# Patient Record
Sex: Male | Born: 1935 | Race: White | Hispanic: No | Marital: Married
Health system: Southern US, Community
[De-identification: ages and names within clinical notes are randomized; demographics above are authoritative.]

---

## 2006-06-22 ENCOUNTER — Emergency Department: Payer: Self-pay | Admitting: Emergency Medicine

## 2007-06-02 ENCOUNTER — Ambulatory Visit: Payer: Self-pay | Admitting: Internal Medicine

## 2007-07-04 ENCOUNTER — Other Ambulatory Visit: Payer: Self-pay

## 2007-07-04 ENCOUNTER — Inpatient Hospital Stay: Payer: Self-pay | Admitting: Internal Medicine

## 2008-08-02 ENCOUNTER — Ambulatory Visit: Payer: Self-pay | Admitting: Internal Medicine

## 2009-11-03 ENCOUNTER — Ambulatory Visit: Payer: Self-pay | Admitting: Internal Medicine

## 2009-11-20 IMAGING — US ULTRASOUND AORTA
1 series · 17 of 18 positions shown · non-contrast
Comparison: none

REASON FOR EXAM: Known AAA,  compare to last CT
COMMENTS:

[Series 1: ultrasound aorta · 17 of 18 slices shown]
[im 1/18]
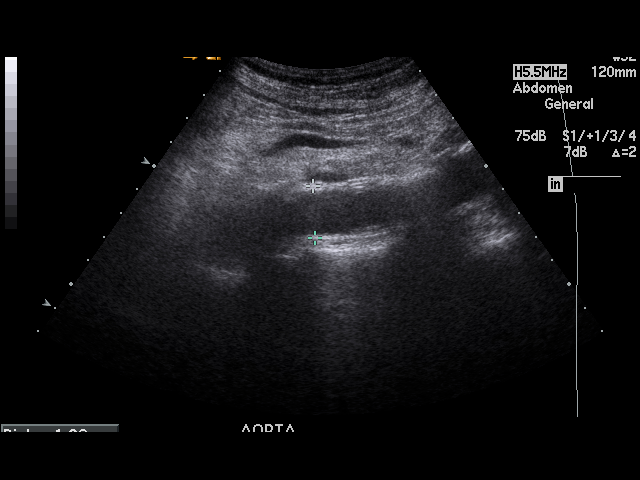
[im 2/18]
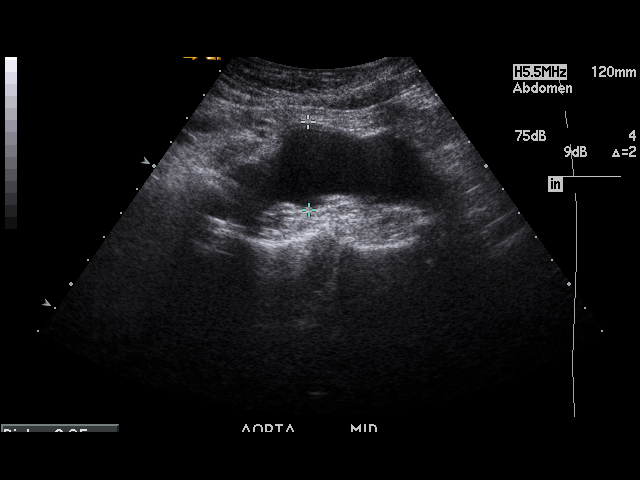
[im 3/18]
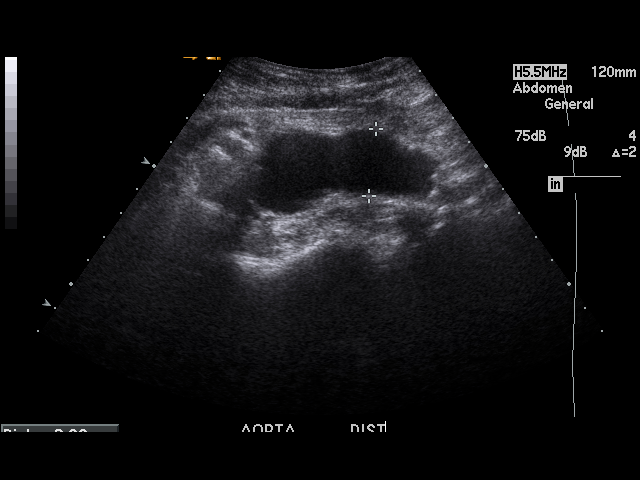
[im 4/18]
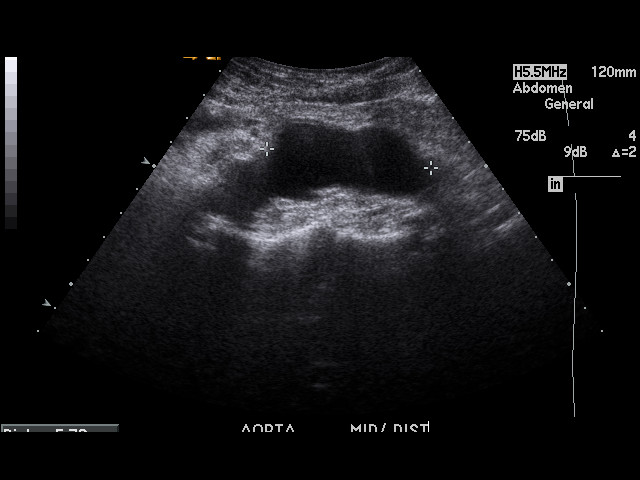
[im 5/18]
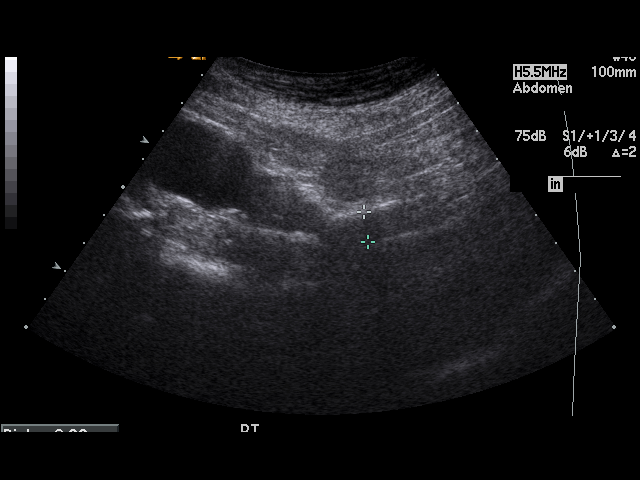
[im 6/18]
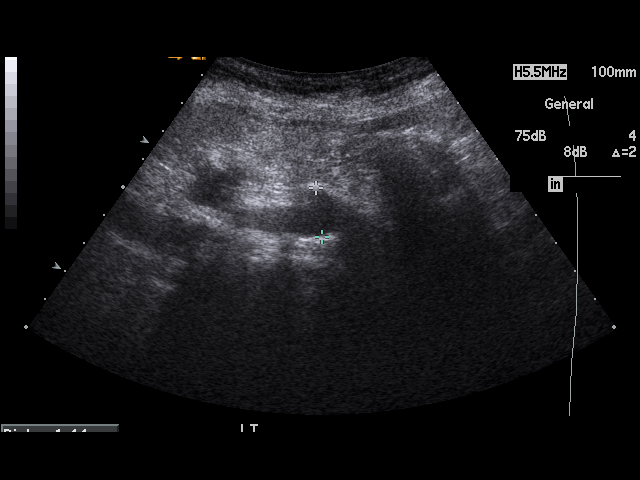
[im 7/18]
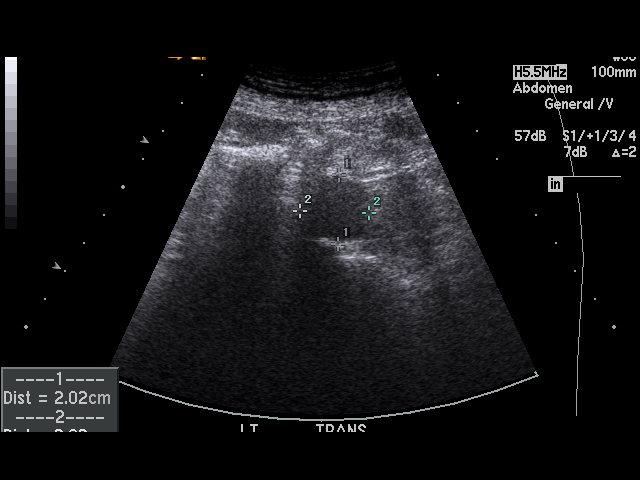
[im 8/18]
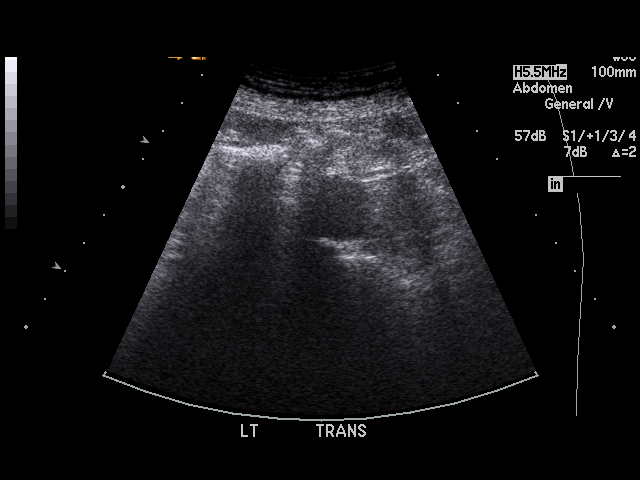
[im 10/18]
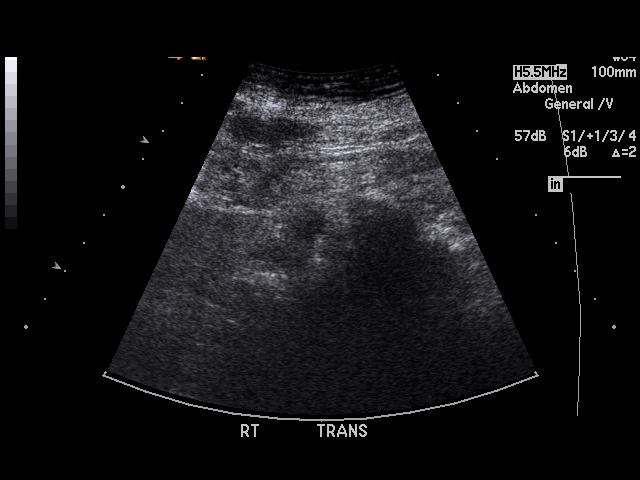
[im 11/18]
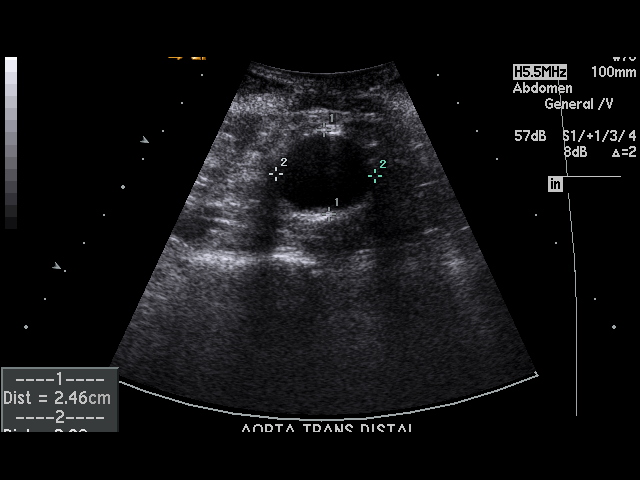
[im 12/18]
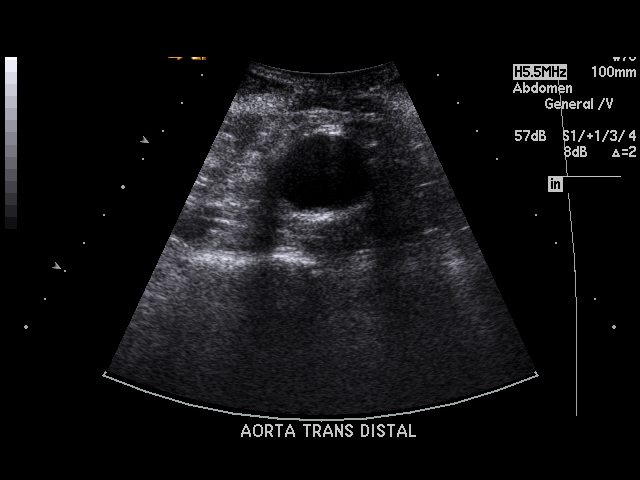
[im 13/18]
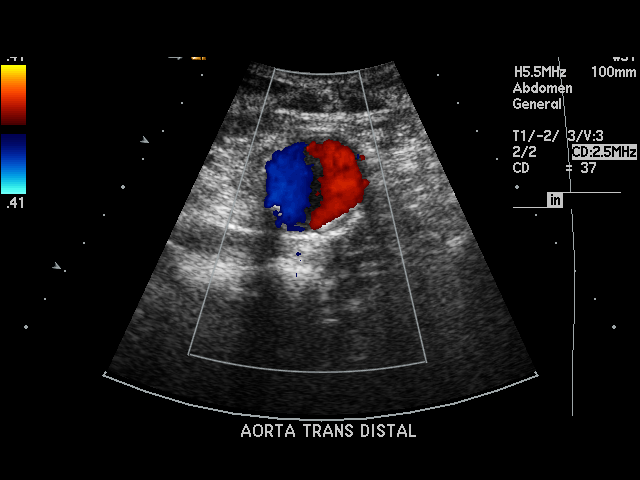
[im 14/18]
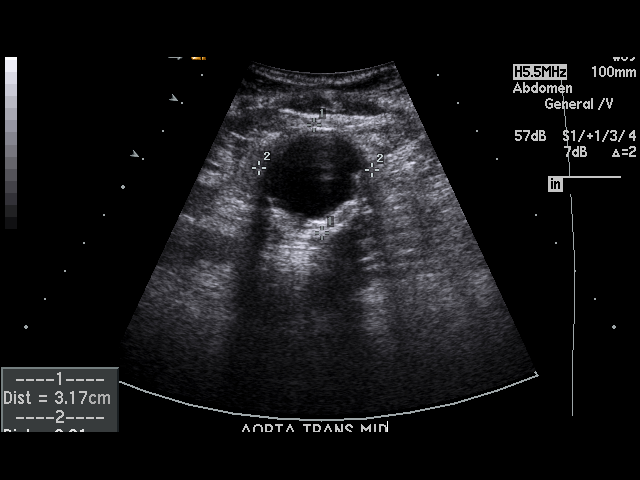
[im 15/18]
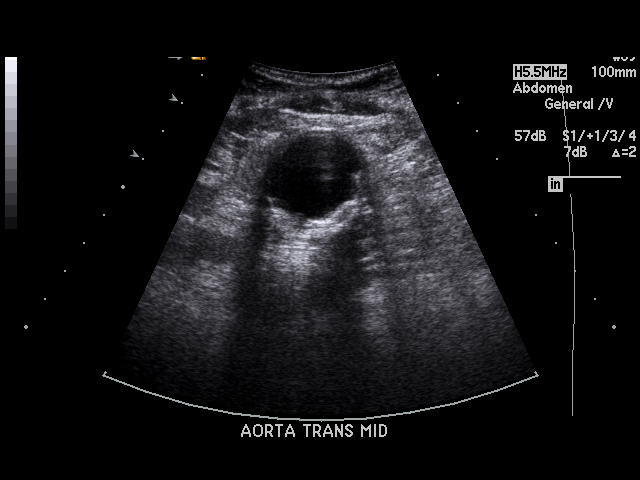
[im 16/18]
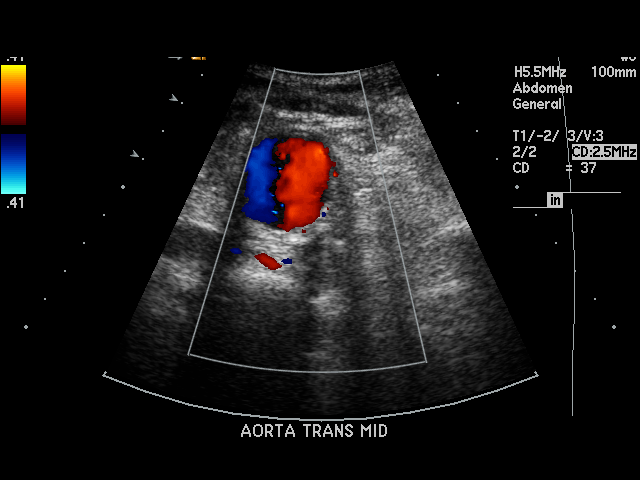
[im 17/18]
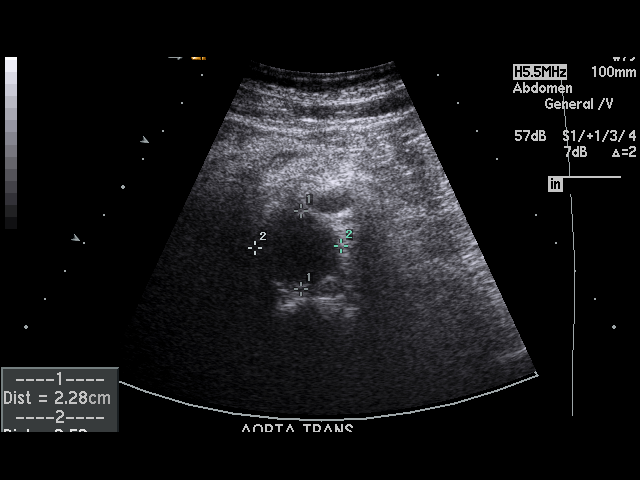
[im 18/18]
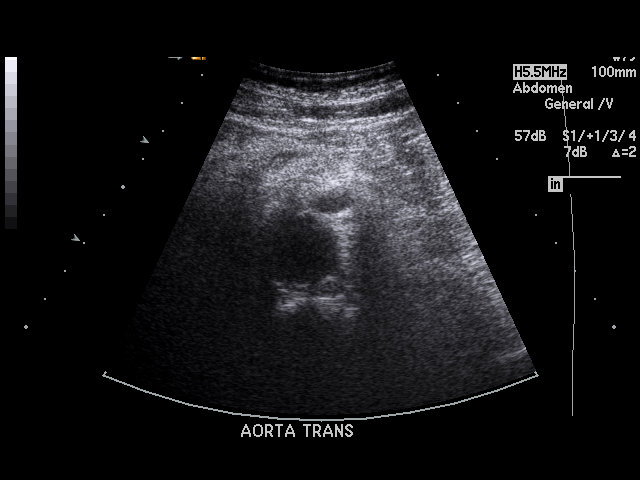

[17 of 18 positions shown; findings below may reference images not displayed]

PROCEDURE:     US  - US AORTA  - June 02, 2007  [DATE]

RESULT:     There is an elongated/lobulated aneurysm involving the mid and
distal abdominal aorta. The relationship to the renal arteries is uncertain.
This could be further evaluated by CTA, if clinically desired. The aneurysm
measures 3.17 cm at maximum AP diameter x 3.31 cm at maximum transverse
diameter. The aneurysm length measures approximately 5.7 cm. The common
iliac arteries are visualized bilaterally. There is a 2.0 cm aneurysm of the
proximal LEFT internal iliac. The RIGHT internal iliac measures 1.17 cm at
maximum diameter.
IMPRESSION: 1.  There is an elongated abdominal aorta aneurysm of the mid and distal
aorta as noted above and which measures 3.17 cm x 3.31 cm.
2.  There is a 2.0 cm aneurysm of the LEFT internal iliac artery.

## 2011-08-20 ENCOUNTER — Ambulatory Visit: Payer: Self-pay | Admitting: Internal Medicine

## 2014-05-03 ENCOUNTER — Ambulatory Visit
Admit: 2014-05-03 | Disposition: A | Discharge status: Home / Self Care | Payer: Self-pay | Attending: Family Medicine | Admitting: Family Medicine

## 2014-05-04 ENCOUNTER — Inpatient Hospital Stay: Payer: Self-pay | Admitting: Internal Medicine

## 2014-06-03 ENCOUNTER — Ambulatory Visit
Admit: 2014-06-03 | Disposition: A | Discharge status: Home / Self Care | Payer: Self-pay | Attending: Family Medicine | Admitting: Family Medicine

## 2014-06-27 LAB — SURGICAL PATHOLOGY

## 2014-07-03 NOTE — Consult Note (Signed)
Feels better today. No evidence of SBP. Remains on Abx. WBC down. WIll plan EGD today to check for portal HTN, esophageal varices, and submucosal lesion. Since INR ok, plan to do bx of cardia lesion if seen. Thanks.  Electronic Signatures: Lutricia Feilh, Samyria Rudie (MD)  (Signed on 03-Mar-16 13:20)  Authored  Last Updated: 03-Mar-16 13:20 by Lutricia Feilh, Ronnesha Mester (MD)

## 2014-07-03 NOTE — Consult Note (Signed)
Pt now back from paracentesis. Seen and examined. Poor historian. Hx of diverticular bleeding in 2009. Hx of back surgery in the past. Hx of prostate ca, s/p XRT. Abdominal pain, mainly RUQ area x > 5360yr. Increasing abdominal distension. Decreased appetite with weight loss. Alternating diarrhea with constipation. No rectal bleeding or melena. No fever/chills. U/S show gallstones/sludge. No evidence of cholecystitis. CT showed poss submucosal lesion in cardia, ascites, and probable cirrhosis and portal HTN. Denies alcohol use. WBC high. Paracentesis done to check for SBP. RUQ area soft and nontender now. Agree with Abx coverage. Agree with blood cx. Will eventually plan on EGD to evaluate stomach/esophagus. Await ascitic fluid analysis. Will follow. Thanks.  Electronic Signatures: Lutricia Feilh, Yehuda Printup (MD)  (Signed on 02-Mar-16 15:56)  Authored  Last Updated: 02-Mar-16 15:56 by Lutricia Feilh, Amariya Liskey (MD)

## 2014-07-03 NOTE — Discharge Summary (Signed)
PATIENT NAME:  Kenneth Meyers, Quanell W MR#:  [id number] DATE OF BIRTH:  [birth year]  DATE OF ADMISSION:  05/04/2014 DATE OF DISCHARGE:  05/07/2014   PRESENTING COMPLAINT: Abdominal pain.   DISCHARGE DIAGNOSES:  1. Cirrhosis of liver, etiology unknown.  2. Ascites with status post paracentesis.  3. Suspected chronic lymphocytic leukemia. No further workup per patient and family request.  4. Generalized weakness.  5. Duodenal ulcers.   PROCEDURES: 1. Paracentesis with removal of about 2.3 liters. 2. EGD showed duodenal ulcers.   NO CODE. DO NOT RESUSCITATE.   MEDICATIONS:  1. Alprazolam 0.25 mg p.o. every 8 hours as needed.  2. Protonix 40 mg b.i.d.   FOLLOWUP: 1. Follow up with Dr. Sampson GoonFitzgerald in 1 to 2 weeks.   2. Hospice to follow up at home.  CONSULTATIONS: 1. GI, Ezzard Standingaul Y. Bluford Kaufmannh, MD. 2. Oncology, Sandeep R. Sherrlyn HockPandit, MD.  BRIEF SUMMARY OF HOSPITAL COURSE: Mr. [name] is a very pleasant, 81 year old, Caucasian gentleman who comes into the Emergency Room with abdominal pain. He was found to have: 1. Cirrhosis with portal hypertension, etiology unknown. The patient underwent endoscopy. No varices were noted. However did have some gastric ulcerations and was started on PPI. He also underwent paracentesis with removal of 2.3 liters of fluid. Abdominal fluid was not infected. Antibiotics were discontinued. The patient was placed on PPI.  2. Leukocytosis with predominant lymphocytosis. The patient was seen by oncology. The patient's pathology differential showed B-cell morphology concerning for possible CLL. After discussion by Dr. Sherrlyn HockPandit with patient and family, they did not want to pursue any further workup. The patient's daughter was recommended to follow up with Dr. Sherrlyn HockPandit in case they decide on pursuing further workup.  3. Type 2 diabetes. A1c was 5.1. The patient is not on any medication.  4. Anxiety. PRN Xanax.  5. Duodenal ulcers, started on PPI. 6. Palliative care saw patient in  consultation for goals of care in light of probable CLL and other comorbidities, and patient and daughter decided patient requested DO NOT RESUSCITATE. The patient will be followed by hospice. Family was agreeable with the patient being followed by hospice at home, which had been set up.  Hospital stay otherwise remained stable.   TIME SPENT: 40 minutes.      ____________________________ Wylie HailSona A. Allena KatzPatel, MD sap:jh D: 05/08/2014 06:54:00 ET T: 05/08/2014 10:06:13 ET JOB#: [id number]  cc: Stann Mainlandavid P. Sampson GoonFitzgerald, MD Patricie Geeslin A. Allena KatzPatel, MD, <Dictator>    Willow OraSONA A Reba Hulett MD ELECTRONICALLY SIGNED 05/14/2014 14:26

## 2014-07-03 NOTE — Consult Note (Signed)
Chief Complaint:  Subjective/Chief Complaint Continues to feel better. No abd pain now. WBC slowly coming down. Tolerating liquid diet.   VITAL SIGNS/ANCILLARY NOTES: **Vital Signs.:   04-Mar-16 07:56  Vital Signs Type Q 8hr  Temperature Temperature (F) 98.2  Celsius 36.7  Temperature Source oral  Pulse Pulse 65  Respirations Respirations 18  Systolic BP Systolic BP 119  Diastolic BP (mmHg) Diastolic BP (mmHg) 63  Mean BP 81  Pulse Ox % Pulse Ox % 92  Pulse Ox Activity Level  At rest  Oxygen Delivery Room Air/ 21 %   Brief Assessment:  GEN no acute distress   Cardiac Regular   Respiratory clear BS   Gastrointestinal mild ascites. nontender   Lab Results: Routine Chem:  04-Mar-16 04:25   Result Comment DIFFERENTIAL - SLIDE PREVIOUSLY REVIEWED BY PATHOLOGIST  Result(s) reported on 06 May 2014 at 05:31AM.  Routine Hem:  04-Mar-16 04:25   WBC (CBC)  19.4  RBC (CBC)  3.33  Hemoglobin (CBC)  11.0  Hematocrit (CBC)  33.1  Platelet Count (CBC)  78  MCV 99  MCH 32.9  MCHC 33.2  RDW  16.9  Segmented Neutrophils 6  Lymphocytes 32  Variant Lymphocytes 56  Monocytes 6  Diff Comment 1 ANISOCYTOSIS  Diff Comment 2 NORMAL PLT MORPHOLGY  Diff Comment 3 SMUDGE CELLS  Result(s) reported on 06 May 2014 at 05:31AM.   Assessment/Plan:  Assessment/Plan:  Assessment Cirrhosis, ascites, portal HTN, sepsis?   Plan Continue Abx. Continue daily PPI. Will advance diet to reg. Will have Dr. Marva PandaSkulskie check on patient this weekend.   Electronic Signatures: Lutricia Feilh, Swayzie Choate (MD)  (Signed 04-Mar-16 10:27)  Authored: Chief Complaint, VITAL SIGNS/ANCILLARY NOTES, Brief Assessment, Lab Results, Assessment/Plan   Last Updated: 04-Mar-16 10:27 by Lutricia Feilh, Marili Vader (MD)

## 2014-07-03 NOTE — Consult Note (Signed)
PATIENT NAME:  Kenneth Meyers, Kenneth Meyers MR#:  [id number] DATE OF BIRTH:  June 19, 1915  DATE OF CONSULTATION:  05/04/2014  REFERRING PHYSICIAN:   CONSULTING PHYSICIAN:  Ezzard Standing. Bluford Kaufmann, MD  DATE OF ADMISSION: 05/04/2014  REASON FOR REFERRAL: Abdominal pain.   HISTORY OF PRESENT ILLNESS: The patient is a 81 year old white male with a history of prostate cancer, who has had radiation in the past. He supposedly had diverticulitis requiring a partial colectomy, although I do not see any evidence of that in any of the records. He did have a diverticular bleed in 2009, for which Dr. Mechele Collin did an endoscopy on it. He is a rather poor historian; therefore, it was difficult to get any history.   He has been complaining of right sided abdominal pain, more in the right upper quadrant area, on and off for the past 2-3 months, with increasing abdominal girth and decreased appetite and weight loss. He denies having any nausea or vomiting. He denies any gross hematochezia or melena. He did have some alternating diarrhea and constipation. Because he has been feeling weak, he was brought in by his daughter for further evaluation.   PAST MEDICAL HISTORY: Notable for hypertension and diabetes, history of diverticulitis, and prostate cancer.   PAST SURGICAL HISTORY: Includes partial colectomy and back surgery many years ago.   SOCIAL HISTORY: He denies any alcohol or tobacco use.   FAMILY HISTORY: Unknown.  REVIEW OF SYSTEMS: Same as the one that Dr. Clent Ridges dictated on admission.   HOME MEDICATIONS: Alprazolam as needed.   ALLERGIES: He has no known drug allergies.   PHYSICAL EXAMINATION: GENERAL: The patient appears to be in no acute distress right now.  HEAD AND NECK: Within normal limits.  CARDIAC: Regular rhythm and rate.  LUNGS: Clear bilaterally.  ABDOMEN: Normoactive bowel sounds, soft. He has a distended abdomen with evidence of ascites. The abdomen was not tender anywhere. I did not notice any rebound or  guarding.  EXTREMITIES: No clubbing, cyanosis, or edema.  NEUROLOGIC: Nonfocal, but he has a very poor historian.  SKIN: Negative. He does look somewhat wasted and thin.   LABORATORY DATA: Sodium 140, potassium 4.2, BUN 14, creatinine 0.92, glucose 126, albumin is low at 2.6, bilirubin is 1.4, alkaline phosphatase 114, AST 49, ALT 19. White count 38.8 thousand, hemoglobin 12.2, platelet count 114,000, MCV is 9. Urinalysis was negative. CT scan showed evidence of cirrhosis with portal hypertension and large ascites. There is a questionable proximal small bowel wall thickening. There is a 15 mm submucosal mass in the gastric cardia. He also had a 5.6 cm infrarenal aortic aneurysm and small pleural effusion. Ultrasound showed some gallstones and gallbladder sludge; however, there was no evidence of acute cholecystitis. There was nodularity of the liver. Again, large amount of ascites was seen.   ASSESSMENT AND PLAN: This is a patient with abdominal pain with increasing abdominal distention. He has a newly diagnosed liver cirrhosis. The patient underwent an ultrasound-guided paracentesis for fluid analysis and to check for spontaneous bacterial peritonitis, which I do not feel he has; nevertheless, with his high white count, I agree with antibiotic coverage. I agree with blood cultures, as well. The patient will eventually need endoscopy done to assess this submucosal lesion in the stomach cardia. We will also need to check for portal hypertension and esophageal varices. I would like to have the white count and possibly infection under better control before we schedule the endoscopy. We will also order a ProTime tomorrow, in case it  is elevated from his liver cirrhosis. That will determine how careful we have to be about doing deep biopsies.   Thank you for the referral.     ____________________________ Ezzard StandingPaul Y. Bluford Kaufmannh, MD pyo:mw D: 05/04/2014 19:34:58 ET T: 05/04/2014 19:54:10 ET JOB#: [id number]  cc: Ezzard StandingPaul  Y. Bluford Kaufmannh, MD, <Dictator> Ezzard StandingPAUL Y Glinda Natzke MD ELECTRONICALLY SIGNED 05/05/2014 14:12

## 2014-07-03 NOTE — Consult Note (Signed)
No obvious esophageal varics or portal gastropathy but multiple superficial duodenal ulcers and antritis seen. No obvious 15mm submucosal lesion seen but either fat fold vs gastric varix seen. Tiny nodule also seen. Bx's taken. Full liquid diet ordered. Make sure patient is on protonix bid. May need EUS later to evaluate cardia further. thanks   Electronic Signatures: Lutricia Feilh, Criston Chancellor (MD) (Signed on 03-Mar-16 13:41)  Authored   Last Updated: 03-Mar-16 13:42 by Lutricia Feilh, Dvaughn Fickle (MD)

## 2014-07-03 NOTE — Consult Note (Signed)
Brief Consult Note: Diagnosis: sbp.   Patient was seen by consultant.   Consult note dictated.   Comments: Attempted to see patient, he is not currently in room- reported to be in US receiving paracentesis. Cell cytology added.  Electronic Signatures: Vevelyn PatLondon, Aanchal Cope H (NP)  (Signed 02-Mar-16 14:32)  Authored: Brief Consult Note   Last Updated: 02-Mar-16 14:32 by Keturah BarreLondon, Ameir Faria H (NP)

## 2014-07-03 NOTE — Consult Note (Signed)
PATIENT NAME:  Kenneth Meyers, Kenneth Meyers MR#:  [id number] DATE OF BIRTH:  November 15, 1915  DATE OF CONSULTATION:  05/06/2014  REFERRING PHYSICIAN:  Santina Evans P. Clent Ridges, MD  CONSULTING PHYSICIAN:  Dodi Leu R. Sherrlyn Hock, MD  REASON FOR CONSULTATION: Persistent leukocytosis, abnormal WBC pathology,? Leukemia.   HISTORY OF PRESENT ILLNESS: The patient is a 81 year old gentleman with past medical history significant for diabetes mellitus, hypertension, prostate cancer status post radiation treatment, diverticulitis, back surgery, partial colectomy for diverticulitis in the past, who was admitted to the hospital on March 2 with a 2 to 13-month history of progressive abdominal pain with increasing abdominal girth and poor appetite. He has become weaker lately. He had a CT scan of the abdomen and pelvis done, which reported findings consistent with portal hypertension with large ascites and probable cirrhosis. He had an ultrasound-guided paracentesis done on March 2, which showed a low albumin of 0.7, LDH 65, glucose 111, culture negative, moderate white cells. In addition, CBC done on March 2 showed WBC count 31,800, with hemoglobin of 12.2, and platelets of 114,000. The patient has been on IV Zosyn and despite this, WBC count yesterday was elevated at 20,200, with platelet count of 80,000 and today, it is still 19,400, with a platelet count 78,000, hemoglobin 11, 6% neutrophils, 32% lymphocytes, and 56% variant lymphocytes. Hematology has been consulted for possible CLL versus other leukemia.   Clinically, the patient states that he continues to be extremely weak. He does not eat much. He is being followed by palliative care, and the plan is to likely discharge him home with hospice. Per discussion with the patient and his daughter present at bedside, they do not want to pursue further work-up. Peripheral blood flow cytometry has already been sent.   PAST MEDICAL: As in HPI above.   SURGICAL HISTORY: As in HPI above.    FAMILY HISTORY: Noncontributory.   SOCIAL HISTORY: Denies smoking or alcohol usage. He recently moved in with his daughter due to progressive weakness.   HOME MEDICATIONS: Xanax 0.25 mg q. 8 hours p.r.n. for anxiety.   ALLERGIES: No known drug allergies.   REVIEW OF SYSTEMS: CONSTITUTIONAL: Significant generalized weakness, mostly resting in bed. No fevers or chills.  HEENT: No headaches, but does get intermittent dizziness/vertigo. No epistaxis, ear or jaw pain. No sinus symptoms.  CARDIAC: Denies any angina, palpitation. No orthopnea or PND.  LUNGS: Has dyspnea on exertion. No progressive cough, hemoptysis, or chest pain.  GASTROINTESTINAL: As in HPI. Increasing abdominal pain and girth, with recent paracentesis done. Currently, no diarrhea or blood in stools.  GENITOURINARY: No dysuria or hematuria.  MUSCULOSKELETAL: Has chronic aches and pains with no change, denies new bone pain.  SKIN: No new rashes or pruritus.  NEUROLOGIC: Denies any new focal weakness, seizures, or loss of consciousness. He does have decreased memory.  ENDOCRINE: No polyuria or polydipsia. Appetite is poor.   PHYSICAL EXAMINATION: GENERAL: The patient is an elderly and frail-looking individual, resting in bed, otherwise awake and oriented to self, place, person and converses appropriately. No acute distress at rest. No icterus. Pallor present.  VITAL SIGNS: 98.2, 65, 18, 119/63, 92% on room air.  HEENT: Normocephalic, atraumatic. Extraocular movements intact. Sclerae anicteric.  NECK: Negative for lymphadenopathy.  CARDIOVASCULAR: S1, S2, regular rate and rhythm.  LUNGS: Bilateral good breath sounds, decreased at bases, no rhonchi noted.  ABDOMEN: Soft, mildly distended, nontender. No hepatosplenomegaly clinically.  EXTREMITIES: Trace edema. No cyanosis.  LYMPHATICS: Small adenopathy in the right axilla and inguinal areas.  SKIN: No generalized rashes. Minor bruising.  NEUROLOGIC: Limited exam. Moves all  extremities spontaneously. Cranial nerves seem intact.    LABORATORY DATA: RESULTS: As in HPI above. In addition, creatinine yesterday was 0.91, with calcium 7.7, bilirubin 1.4, alkaline phosphatase 112, ALT 30 AST 54, albumin low at 2.2. CBC today showed WBC 19,400, with 6% neutrophils, 32% lymphocytes, 56% variant lymphocytes, 6% monocytes, hemoglobin 11.0, platelets 78,000.   IMPRESSION AND RECOMMENDATIONS: A 81 year old gentleman with past medical history as described above, admitted with abdominal symptoms and underwent abdominal paracentesis since he was found to have findings of ascites and likely cirrhosis. The patient also has partially improved persistent leukocytosis with an absolute lymphocytosis, with a differential count today showing only 6% neutrophils. There are smudge cells reported on the smear. In addition, he does have small lymphadenopathy in the right axilla and inguinal areas. Given this, I have explained to the patient and his daughter present at bedside that he could have lymphoproliferative disorder like chronic lymphocytic leukemia, and based on mild lymphocytosis, mild anemia and no bulky lymphadenopathy or hepatosplenomegaly on exam, it appears that the chronic lymphocytic leukemia is asymptomatic and could be early stage, except that he does have a platelet count less than 100,000 which, if it is secondary to chronic lymphocytic leukemia-related idiopathic thrombocytopenia purpura, then  this could still be stage II chronic lymphocytic leukemia, otherwise if platelet count is low because of extensive marrow involvement by chronic lymphocytic leukemia, then it represents stage IV chronic lymphocytic leukemia. Peripheral blood flow cytometry for confirmation of this diagnosis has already been sent and is pending at this time. Given his advanced age, progressively declining condition, the patient and his daughter state that they do not want any further work-up or treatment if he does  chronic lymphocytic leukemia. In fact, it appears that he is being planned to be discharged soon on hospice. Given this, no scheduled oncology follow-up is needed at thias time and will sign off of the case. Please reconsult if needed as inpatient or, if necessary, as outpatient. The patient and daughter are agreeable to this plan.   Thank you for the referral. Please feel free to contact me if any additional questions     ____________________________ Maren ReamerSandeep R. Sherrlyn HockPandit, MD srp:mw D: 05/06/2014 17:26:38 ET T: 05/06/2014 18:30:58 ET JOB#: [id number]  cc: Tyge Somers R. Sherrlyn HockPandit, MD, <Dictator> Wille CelesteSANDEEP R Atiana Levier MD ELECTRONICALLY SIGNED 05/08/2014 9:22

## 2014-07-03 NOTE — H&P (Signed)
PATIENT NAME:  Kenneth Meyers, [name] MR#:  [id number] DATE OF BIRTH:  16-Dec-1915  DATE OF ADMISSION:  05/04/2014  REFERRING EMERGENCY ROOM PHYSICIAN: [place] SinkJade J. Dolores FrameSung, MD   PRIMARY CARE PHYSICIAN: Clydie Braunavid Fitzgerald, MD.   CHIEF COMPLAINT: Abdominal pain.   HISTORY OF PRESENT ILLNESS: This very pleasant 81 year old man with past medical history of prostate cancer, status post radiation, diverticulitis status post colectomy, hypertension, diabetes, and anxiety presents today with 2 to 3 months of worsening abdominal pain. The patient is a fairly poor historian. He is accompanied by his grandson, who does not add additional history. The patient reports right-sided abdominal pain on and off for about 2 to 3 months with increasing abdominal girth and decreased appetite. He has had nausea, no vomiting. He has had a decreased appetite. Has not been eating very much. No diarrhea. No melena or hematochezia. No fevers or chills. He has also become increasingly weak over this same time, and actually moved in with his daughter earlier this week due to deconditioning and inability to get across the house to the bathroom.   PAST MEDICAL HISTORY:  1. Hypertension.  2. Diabetes.  3. Prostate cancer, status post radiation therapy.  4. Diverticulitis.   PAST SURGICAL HISTORY:  1. Partial colectomy for diverticulitis.  2. Back surgery.   SOCIAL HISTORY: The patient moved and with his daughter 2 days ago due to weakness. He uses a walker for ambulation. Does not use any supplemental oxygen. He has never been an alcohol drinker or a smoker. No illicit substance abuse.   FAMILY MEDICAL HISTORY: The patient does not know his family medical history.   REVIEW OF SYSTEMS:  GENERAL: No fevers or chills. Positive as above for weakness and increasing abdominal girth.  HEENT: No changes in hearing or vision. No pain in eyes or ears. No sore throat or difficulty swallowing. No sinus congestion.  RESPIRATORY: No shortness of  breath, dyspnea, wheezing, cough, hemoptysis.  CARDIOVASCULAR: No chest pain, palpitations, syncope.  ABDOMEN: Positive for increasing abdominal girth and diffuse abdominal pain, particularly on the right side. No vomiting or diarrhea. No melena, hematochezia. Positive for some nausea and decreased appetite.  SKIN: No new skin lesions or rashes or wounds.  MUSCULOSKELETAL: No new pain in the neck, back, knees, shoulders, or hips. Positive for diffuse weakness. No focal weakness.  NEUROLOGIC: No focal weakness or numbness. No confusion. He does have decreased memory. He no headaches, seizures or history of stroke.  PSYCHIATRIC: Positive for anxiety. No bipolar disorder or schizophrenia.   HOME MEDICATIONS:  Alprazolam 0.25 mg 1 tablet every 8 hours as needed for anxiety.   ALLERGIES: NO KNOWN ALLERGIES.   PHYSICAL EXAMINATION:  VITAL SIGNS: Temperature 97.7, pulse 66, respirations 17, blood pressure 141/71, oxygenation 96% on room air.  GENERAL: No acute distress.  HEENT: Pupils equal, round, and reactive to light. Conjunctivae clear, extraocular motion intact. Oral mucous membranes are dry. Posterior oropharynx is clear with no exudate, edema, or erythema. There are upper and lower dentures in place. Trachea is midline.  NECK: Supple. No cervical lymphadenopathy.  RESPIRATORY: Lungs clear to auscultation bilaterally with good air movement.  CARDIOVASCULAR: Heart sounds are distant, regular rate and rhythm. No murmurs, rubs, or gallops. There is 1+ pitting edema over both ankles. Peripheral pulses are 2+.  ABDOMEN: Soft, distended, tympanic over the upper quadrants, dull over the lower quadrants with a fluid wave. No significant tenderness. No guarding, no rebound, no hepatosplenomegaly noted.  MUSCULOSKELETAL: No joint effusions. Range of  motion normal. Strength 5/5 throughout.  NEUROLOGIC: Cranial nerves II through XII grossly intact. Strength and sensation intact. Nonfocal examination.   PSYCHIATRIC: The patient is alert and oriented with  fairly good insight into his clinical condition. He is a poor historian and does not have much of a sense of chronology of his complaint.   LABORATORY DATA: Sodium 140, potassium 4.2, chloride 108, bicarbonate 24, BUN 14, creatinine 0.92 glucose 126, protein 5.6, albumin 2.6, bilirubin 1.4, alkaline phosphatase 114, AST 49, ALT and 19. Troponin less than 0.02. White blood cells 31.8, hemoglobin 12.2, platelets 114,000, MCV is 99.   Urinalysis is negative for signs of infection with 2 white blood cells per high-powered field. Lactate is 1.8.   IMAGING: CT of the abdomen and pelvis with contrast shows portal hypertension with shunts and large ascites, probable cirrhosis. Proximal small bowel fold thickening, which is likely from venous congestion, less likely enteritis. A 15 mm submucosal mass in the gastric cardia, favoring GIST. A 5.6 cm infrarenal aortic aneurysm. Small pleural effusions, left greater than right.   Abdomen 3-way shows no evidence of bowel obstruction, moderate left pleural effusion.   Ultrasound showing sludge and stones partially filling the gallbladder. Gallbladder otherwise unremarkable. Mildly nodular hepatic contra, likely reflecting hepatic cirrhosis. Relatively large amount of ascites in all 4 quadrants of the abdomen.   ASSESSMENT AND PLAN:  1. Abdominal pain: This is likely due to ascites due to cirrhosis. I have ordered an ultrasound-guided paracentesis with fluid studies for further evaluation. This seems to be a new diagnosis. He does not recall ever been being told he had liver disease. We will consult gastroenterology, as he will likely need esophagogastroduodenoscopy to evaluate for esophageal varices.  2. Leukocytosis: Concern for spontaneous bacterial peritonitis, though he is not overtly tender. Chest x-ray with no pneumonia. He does have a pleural effusion, which may also be due to decreased albumin at. I have  ordered a culture and Gram stain of ascitic fluid. We will start coverage with Zosyn. Urinalysis is negative for infection. Blood cultures have been drawn.  3. Hypertension: The patient not overtly hypertensive at this time. I will not start any antihypertensives. He is not on any at home.  4. Diabetes: We will check a hemoglobin A1c. Blood sugar at this point is normal.  5. Anxiety: Continue alprazolam as he takes at home.   CODE STATUS: The patient is a DO NOT RESUSCITATE. I discussed this with him at admission. He states that he has a DO NOT RESUSCITATE form on his fridge at home. He is able to verbalize that he does not want cardiopulmonary resuscitation. This conversation was had in the presence of his son, who is in agreement.   TIME SPENT ON ADMISSION: 45 minutes.    ____________________________ Ena Dawley. Clent Ridges, MD cpw:ap D: 05/04/2014 16:54:01 ET T: 05/04/2014 17:52:27 ET JOB#: [id number]  cc: Santina Evans P. Clent Ridges, MD, <Dictator> Gale Journey MD ELECTRONICALLY SIGNED 05/14/2014 10:56

## 2014-07-03 DEATH — deceased

## 2016-10-22 IMAGING — CT CT ABD-PELV W/ CM
2 of 5 series · 16 of 46 positions shown, 18 images · IV contrast (omnipaque)
Comparison: 08/20/2011

CLINICAL DATA: Leukocytosis and right-sided abdominal pain

EXAM:
CT ABDOMEN AND PELVIS WITH CONTRAST
TECHNIQUE: Multidetector CT imaging of the abdomen and pelvis was performed
using the standard protocol following bolus administration of
intravenous contrast.
CONTRAST:  80 cc Omnipaque 300 intravenous

[Series 2: routine abd pel with · axial · 0.70mm/px · z∈[-494,-68]mm · 13 of 95 slices shown, 15 images]
[im 5/95  soft-tissue]
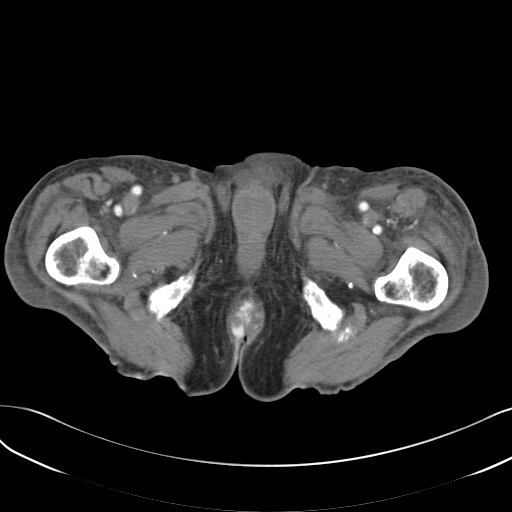
[im 5/95  bone]
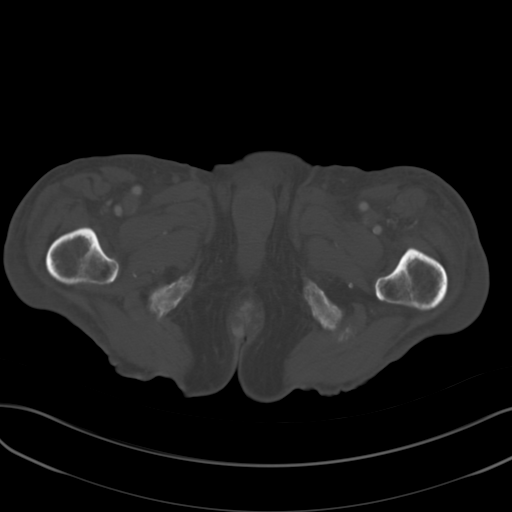
[im 15/95  soft-tissue]
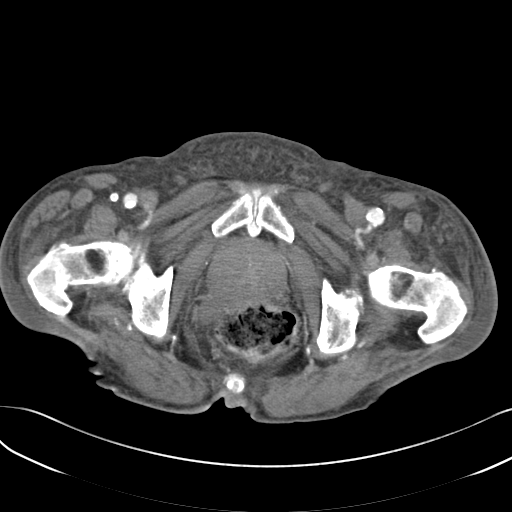
[im 19/95  soft-tissue]
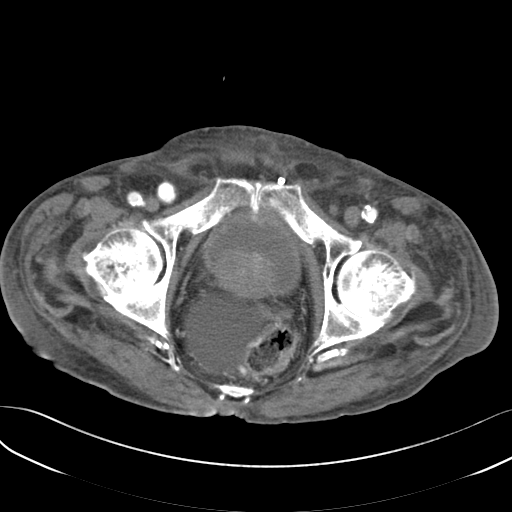
[im 29/95  soft-tissue]
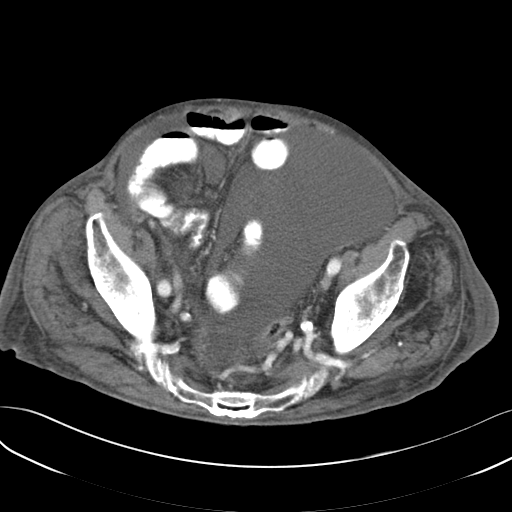
[im 33/95  soft-tissue]
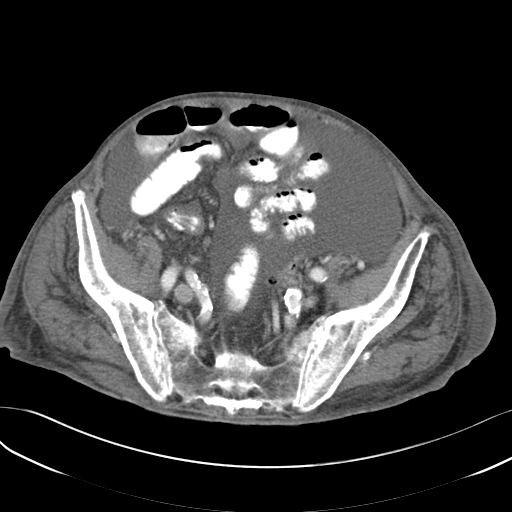
[im 43/95  soft-tissue]
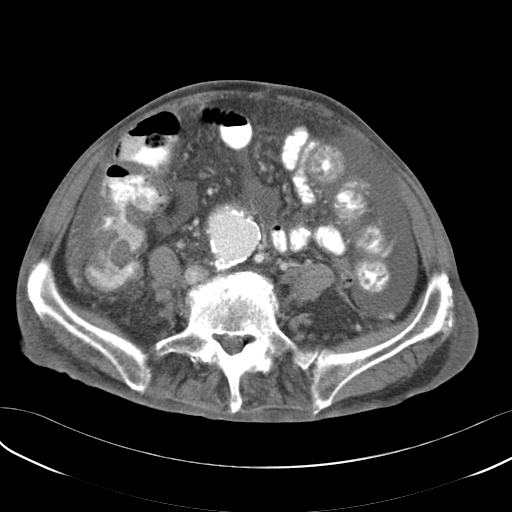
[im 48/95  soft-tissue]
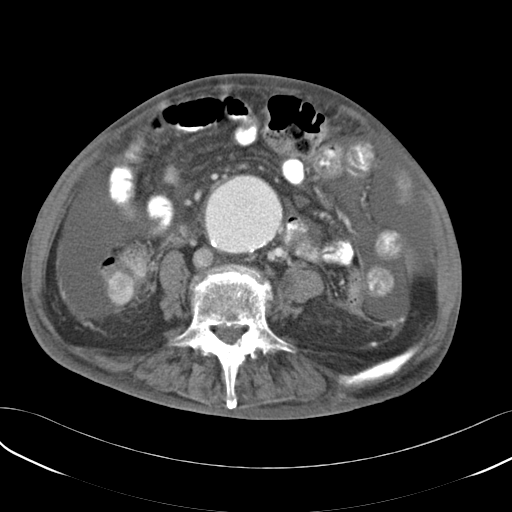
[im 52/95  soft-tissue]
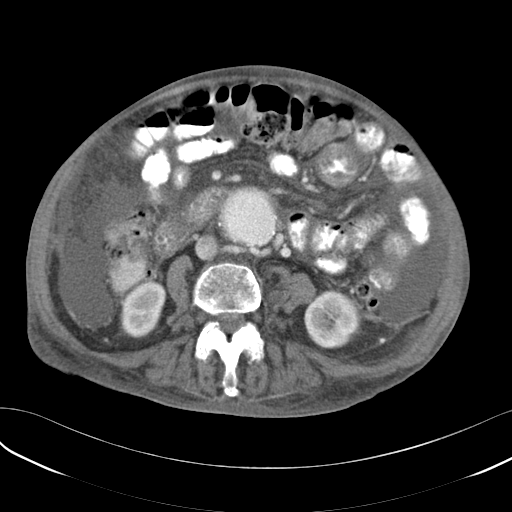
[im 62/95  soft-tissue]
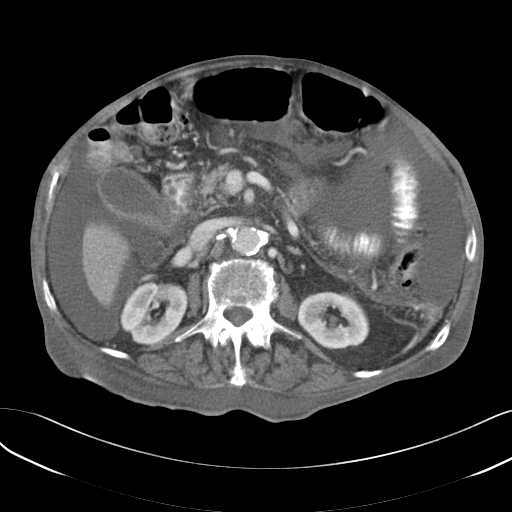
[im 62/95  bone]
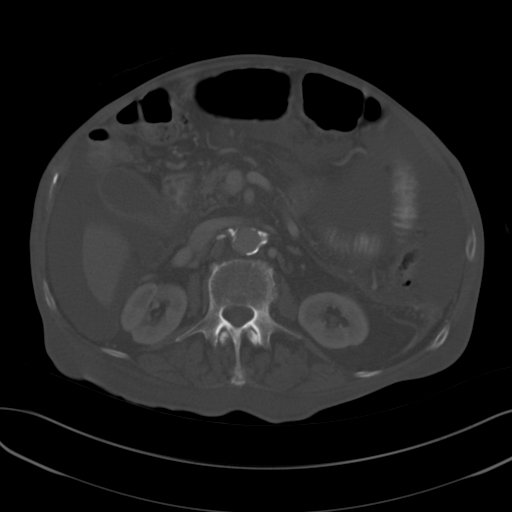
[im 66/95  soft-tissue]
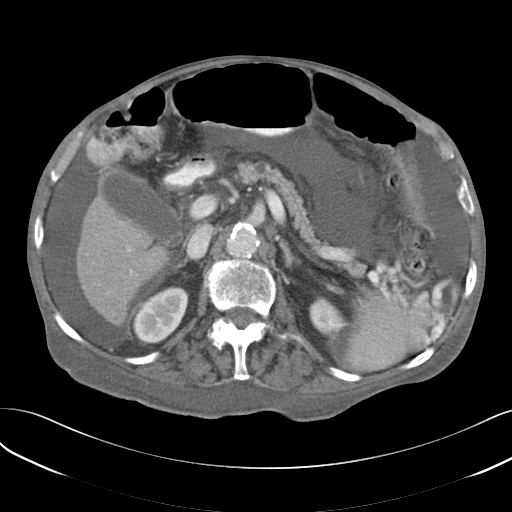
[im 76/95  soft-tissue]
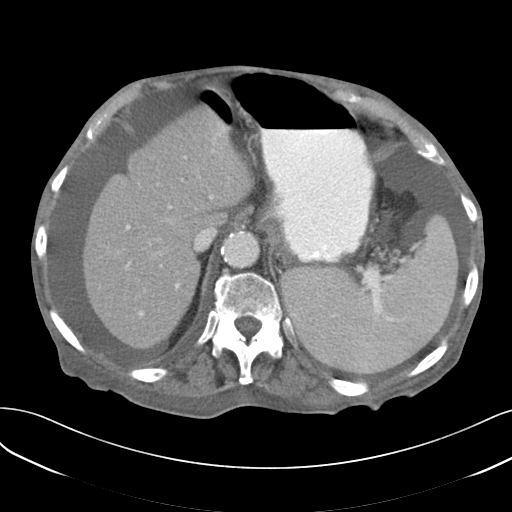
[im 80/95  soft-tissue]
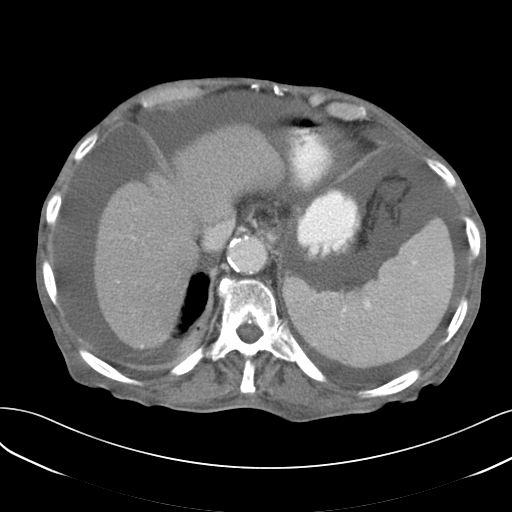
[im 90/95  soft-tissue]
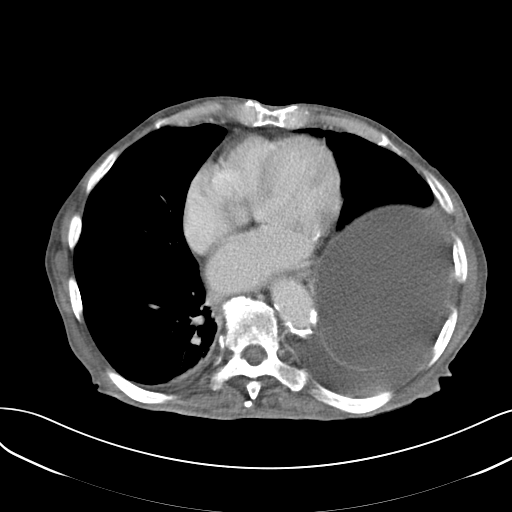

[Series 6: cor routine abd pel with · coronal · 0.98mm/px · 3 of 132 slices shown]
[im 44/132  soft-tissue]
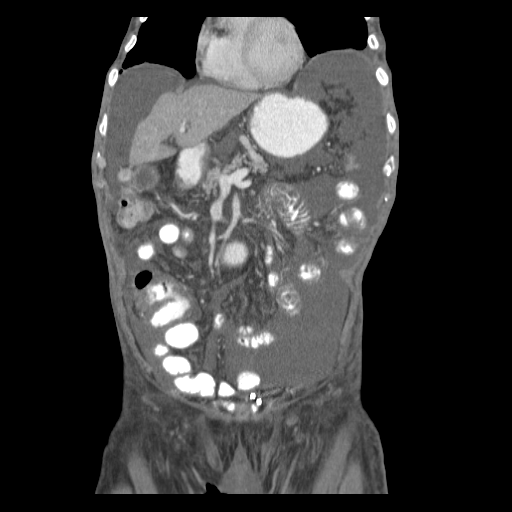
[im 59/132  soft-tissue]
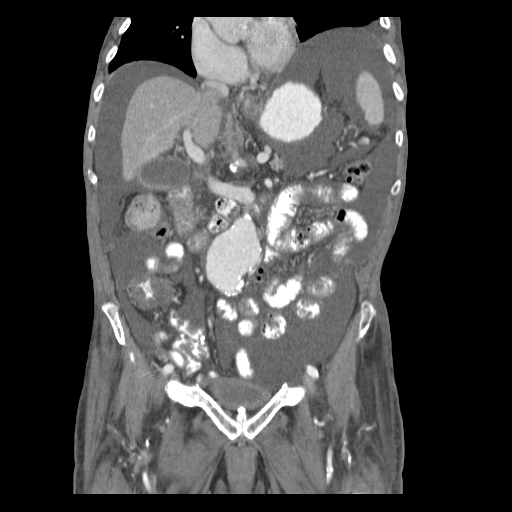
[im 73/132  soft-tissue]
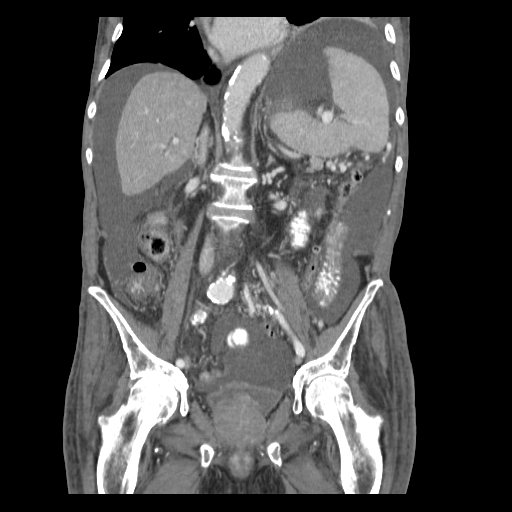

[16 of 46 positions shown; findings below may reference images not displayed]

FINDINGS: BODY WALL: Left inguinal hernia repair using mesh.

LOWER CHEST: Small, left greater than right pleural effusions with
left lower lobe atelectasis has partially visible. The left
diaphragm is elevated.

ABDOMEN/PELVIS:

Liver: There is mild undulation of the liver surface and widening of
the hepatic fissures. There is a large volume of simple appearing
ascites and multiple shunts (especially spleenorenal and
hemorrhoidal). No evidence of mass lesion. The portal vein is
patent.

Biliary: Mild circumferential wall thickening, likely reactive.
There is no calcified gallstone or pericholecystic edema.

Pancreas: 8 mm cyst in the ventral central pancreas, not increased
from 5916. No main duct enlargement.

Spleen: Interval increase in splenic volume, now up to 7 mm in
thickness. A hypoenhancing nodule in the splenic hilum is likely
incidental.

Adrenals: Unremarkable.

Kidneys and ureters: No hydronephrosis or stone.

Bladder: Circumferential bladder wall thickening, likely from
prostate enlargement and under distention.

Reproductive: Modestly enlarged prostate.

Bowel: Small bowel fold thickening, especially proximally, favored
represent venous congestion. Patient has extensive diverticulosis of
the colon, without active inflammation. There is a ileal
enteroenteric anastomosis which is unremarkable. The appendix is not
identified. There is a 15 mm nodule at the gastric cardia which
appears submucosal. This was not seen well previously, but there was
less distension on the prior study. Appearance and location favors
gastrointestinal stromal tumor.

Retroperitoneum: No mass or adenopathy.

Peritoneum: No ascites or pneumoperitoneum.

Vascular: 56 mm diameter infrarenal aortic aneurysm without evidence
of rupture. The left common iliac and right common iliac arteries
are aneurysmal to 21 mm on the right in 23 mm on the left. Venous
shunts as noted above.

OSSEOUS: No acute abnormalities.
IMPRESSION: 1. Portal hypertension with shunts/large ascites and probable
cirrhosis.
2. Proximal small bowel fold thickening which is likely from venous
congestion, less likely enteritis.
3. 15 mm submucosal mass in the gastric cardia, favor GIST.
4. 5.6 cm infrarenal aortic aneurysm.
5. Small pleural effusions, left greater than right.
